# Patient Record
Sex: Male | Born: 1985 | Race: Asian | Hispanic: No | Marital: Married | Smoking: Never smoker
Health system: Southern US, Community
[De-identification: ages and names within clinical notes are randomized; demographics above are authoritative.]

---

## 2016-02-18 ENCOUNTER — Ambulatory Visit (INDEPENDENT_AMBULATORY_CARE_PROVIDER_SITE_OTHER): Payer: No Typology Code available for payment source

## 2016-02-18 ENCOUNTER — Ambulatory Visit (INDEPENDENT_AMBULATORY_CARE_PROVIDER_SITE_OTHER): Payer: No Typology Code available for payment source | Admitting: Student

## 2016-02-18 VITALS — BP 120/90 | HR 125 | Temp 98.2°F | Resp 18 | Ht 63.5 in | Wt 186.4 lb

## 2016-02-18 DIAGNOSIS — M5441 Lumbago with sciatica, right side: Secondary | ICD-10-CM

## 2016-02-18 DIAGNOSIS — R000 Tachycardia, unspecified: Secondary | ICD-10-CM | POA: Diagnosis not present

## 2016-02-18 MED ORDER — PREDNISONE 20 MG PO TABS: 20.0000 mg | ORAL_TABLET | Freq: Every day | ORAL | 0 refills | Status: DC

## 2016-02-18 NOTE — Assessment & Plan Note (Signed)
Lumbar x-rays negative.  Prednisone for 5 days, then stop.  Follow up next week to reassess.

## 2016-02-18 NOTE — Progress Notes (Signed)
   Subjective:    Patient ID: Julian Mclean, male    DOB: 08/24/1985, 31 y.o.   MRN: [id number]  HPI Presents with 3 days of back pain and 1 day of radiculopathy.  No known injury, no known heavy lifting.  Had this happen 1 year ago, which went away with medications.  Denies heavy lifting at that time.  He denies bowel/bladder incontinence.    Denies chest pain, shortness of breath, palpitations on exam.  No family history of cardiac or thyroid disease.  No recent medication, drug use.  PMHx - Updated and reviewed.  Contributory factors include: Negative PSHx - Updated and reviewed.  Contributory factors include:  Negative FHx - Updated and reviewed.  Contributory factors include:  Negative Social Hx - Updated and reviewed. Contributory factors include: Negative Medications - reviewed     Review of Systems  Constitutional: Negative for chills, fatigue and fever.  HENT: Negative for congestion and sore throat.   Eyes: Negative for pain and itching.  Respiratory: Negative for cough, chest tightness and shortness of breath.   Cardiovascular: Negative for chest pain, palpitations and leg swelling.  Genitourinary: Negative for difficulty urinating, dysuria and urgency.  Musculoskeletal: Positive for back pain. Negative for arthralgias, joint swelling and myalgias.  Skin: Negative for pallor, rash and wound.  Neurological: Negative for dizziness and weakness.  All other systems reviewed and are negative.      Objective:   Physical Exam  Constitutional: He is oriented to person, place, and time. He appears well-developed and well-nourished. No distress.  HENT:  Head: Normocephalic and atraumatic.  Eyes: Conjunctivae are normal. No scleral icterus.  Neck: Normal range of motion. Neck supple. No tracheal deviation present. Thyromegaly present.  Cardiovascular: Regular rhythm.  Exam reveals no gallop and no friction rub.   No murmur heard. tachycardic  Pulmonary/Chest: Effort normal  and breath sounds normal. No respiratory distress. He has no wheezes.  Musculoskeletal: He exhibits no edema, tenderness or deformity.  No weakness of LE b/l, SLE negative b/l  Lymphadenopathy:    He has no cervical adenopathy.  Neurological: He is alert and oriented to person, place, and time. He displays normal reflexes. He exhibits normal muscle tone.  Skin: Skin is warm. No rash noted. He is not diaphoretic. No erythema. No pallor.  Psychiatric: He has a normal mood and affect. His behavior is normal. Judgment and thought content normal.  Nursing note and vitals reviewed.  BP 120/90 (BP Location: Right Arm, Patient Position: Sitting, Cuff Size: Large)   Pulse (!) 125   Temp 98.2 F (36.8 C) (Oral)   Resp 18   Ht 5' 3.5" (1.613 m)   Wt 186 lb 6.4 oz (84.6 kg)   SpO2 99%   BMI 32.50 kg/m         Assessment & Plan:  Tachycardia EKG shows sinus tachycardia.  VS otherwise stable.  Not in extraordinary amount of pain.  Not volume depleted.  Will check TSH, CBC, CMP.  Follow up 1 week.  Acute bilateral low back pain with right-sided sciatica Lumbar x-rays negative.  Prednisone for 5 days, then stop.  Follow up next week to reassess.  Signed,  Corliss MarcusAlicia Barnes, DO [facility] 6:37 PM 02/18/16

## 2016-02-18 NOTE — Assessment & Plan Note (Addendum)
EKG shows sinus tachycardia.  VS otherwise stable.  Not in extraordinary amount of pain.  Not volume depleted.  Will check TSH, CBC, CMP.  Follow up 1 week.

## 2016-02-18 NOTE — Patient Instructions (Signed)
Take 2 tablets of medication daily.  Follow up next week on Tuesday in the afternoon.    Go to Emergency Room if having chest pain, shortness of breath.

## 2016-02-19 LAB — CBC WITH DIFFERENTIAL/PLATELET
BASOS: 0 %
Basophils Absolute: 0 10*3/uL (ref 0.0–0.2)
EOS (ABSOLUTE): 0.1 10*3/uL (ref 0.0–0.4)
EOS: 2 %
HEMATOCRIT: 43.7 % (ref 37.5–51.0)
HEMOGLOBIN: 15.3 g/dL (ref 13.0–17.7)
Immature Grans (Abs): 0 10*3/uL (ref 0.0–0.1)
Immature Granulocytes: 0 %
Lymphocytes Absolute: 3.2 10*3/uL — ABNORMAL HIGH (ref 0.7–3.1)
Lymphs: 40 %
MCH: 28 pg (ref 26.6–33.0)
MCHC: 35 g/dL (ref 31.5–35.7)
MCV: 80 fL (ref 79–97)
MONOCYTES: 7 %
Monocytes Absolute: 0.5 10*3/uL (ref 0.1–0.9)
NEUTROS ABS: 4 10*3/uL (ref 1.4–7.0)
Neutrophils: 51 %
Platelets: 343 10*3/uL (ref 150–379)
RBC: 5.46 x10E6/uL (ref 4.14–5.80)
RDW: 13 % (ref 12.3–15.4)
WBC: 7.8 10*3/uL (ref 3.4–10.8)

## 2016-02-19 LAB — COMPREHENSIVE METABOLIC PANEL
ALBUMIN: 4.9 g/dL (ref 3.5–5.5)
ALT: 32 IU/L (ref 0–44)
AST: 28 IU/L (ref 0–40)
Albumin/Globulin Ratio: 1.6 (ref 1.2–2.2)
Alkaline Phosphatase: 63 IU/L (ref 39–117)
BUN/Creatinine Ratio: 14 (ref 9–20)
BUN: 11 mg/dL (ref 6–20)
Bilirubin Total: 0.6 mg/dL (ref 0.0–1.2)
CO2: 23 mmol/L (ref 18–29)
CREATININE: 0.79 mg/dL (ref 0.76–1.27)
Calcium: 9.9 mg/dL (ref 8.7–10.2)
Chloride: 98 mmol/L (ref 96–106)
GFR, EST AFRICAN AMERICAN: 141 mL/min/{1.73_m2} (ref >59)
GFR, EST NON AFRICAN AMERICAN: 122 mL/min/{1.73_m2} (ref >59)
GLOBULIN, TOTAL: 3 g/dL (ref 1.5–4.5)
GLUCOSE: 102 mg/dL — AB (ref 65–99)
Potassium: 4.3 mmol/L (ref 3.5–5.2)
Sodium: 138 mmol/L (ref 134–144)
TOTAL PROTEIN: 7.9 g/dL (ref 6.0–8.5)

## 2016-02-19 LAB — TSH: TSH: 1.56 u[IU]/mL (ref 0.450–4.500)

## 2016-02-21 ENCOUNTER — Telehealth: Payer: Self-pay

## 2016-02-21 NOTE — Telephone Encounter (Signed)
Pt would like his lab results  

## 2016-02-22 NOTE — Telephone Encounter (Signed)
Please review and advise.

## 2016-02-25 ENCOUNTER — Ambulatory Visit (INDEPENDENT_AMBULATORY_CARE_PROVIDER_SITE_OTHER): Payer: No Typology Code available for payment source | Admitting: Student

## 2016-02-25 VITALS — BP 124/80 | HR 104 | Temp 98.3°F | Resp 17 | Ht 64.0 in | Wt 188.0 lb

## 2016-02-25 DIAGNOSIS — M222X2 Patellofemoral disorders, left knee: Secondary | ICD-10-CM

## 2016-02-25 DIAGNOSIS — M5441 Lumbago with sciatica, right side: Secondary | ICD-10-CM | POA: Diagnosis not present

## 2016-02-25 DIAGNOSIS — R000 Tachycardia, unspecified: Secondary | ICD-10-CM

## 2016-02-25 MED ORDER — NAPROXEN 500 MG PO TABS
500.0000 mg | ORAL_TABLET | Freq: Two times a day (BID) | ORAL | 1 refills | Status: DC
Start: 2016-02-25 — End: 2016-05-21

## 2016-02-25 NOTE — Progress Notes (Signed)
Subjective:    Patient ID: Julian Mclean, male    DOB: 04/09/1985, 31 y.o.   MRN: [id number]  HPI Presents for follow up of back pain and tachycardia.  His back pain has resolved with prednisone. No radicular symptoms or numbness or tingling now.  He has been doing cardio, but no core exercises.  Sciatica resolved with prednisone.    He complains of left knee pain while going up and down stairs and sometimes when he walks or stands for long periods of time.  He denies any recent injury.  Denies swelling to the knee.  Had tachycardia last week on exam.  He denies symptoms of palpitations, chest pain, shortness of breath, syncope/presyncope.  He has been monitoring his heart rate with his FitBit, which seems to be accurate.  It usually runs in the 80's at rest.  It will jump to 120's while walking.  Reviewed normal labs with patient.  PMHx - Updated and reviewed.  Contributory factors include: Negative PSHx - Updated and reviewed.  Contributory factors include:  Negative FHx - Updated and reviewed.  Contributory factors include:  Negative Social Hx - Updated and reviewed. Contributory factors include: Negative Medications - reviewed    Review of Systems  Constitutional: Negative for chills, fatigue and fever.  HENT: Negative for congestion and sore throat.   Eyes: Negative for pain and itching.  Respiratory: Negative for cough, chest tightness and shortness of breath.   Cardiovascular: Negative for chest pain, palpitations and leg swelling.  Genitourinary: Negative for dysuria and urgency.  Musculoskeletal: Positive for back pain. Negative for arthralgias, gait problem, joint swelling and myalgias.  Skin: Negative for pallor, rash and wound.  Neurological: Negative for dizziness, weakness, light-headedness, numbness and headaches.  All other systems reviewed and are negative.      Objective:   Physical Exam  Constitutional: He is oriented to person, place, and time. He appears  well-developed and well-nourished. No distress.  HENT:  Head: Normocephalic and atraumatic.  Eyes: Conjunctivae are normal. No scleral icterus.  Neck: Normal range of motion. Neck supple.  Cardiovascular: Normal rate and regular rhythm.  Exam reveals no gallop and no friction rub.   No murmur heard. Pulmonary/Chest: Effort normal and breath sounds normal.  Musculoskeletal: He exhibits no edema or deformity.  Neurological: He is alert and oriented to person, place, and time.  Skin: Skin is warm. No rash noted. He is not diaphoretic. No erythema. No pallor.  Psychiatric: He has a normal mood and affect. His behavior is normal. Judgment and thought content normal.  MSK: Knee: Normal to inspection with no erythema or effusion or obvious bony abnormalities. Palpation normal with no warmth, joint line tenderness, patellar tenderness, or condyle tenderness. ROM full in flexion and extension and lower leg rotation. Ligaments with solid consistent endpoints including ACL, PCL, LCL, MCL. Negative Mcmurray's, Apley's, and Thessalonian tests. Non painful patellar compression. Patellar glide without crepitus. Patellar and quadriceps tendons unremarkable. Hamstring and quadriceps strength is normal.    BP 124/80 (BP Location: Right Arm, Patient Position: Sitting, Cuff Size: Normal)   Pulse (!) 104   Temp 98.3 F (36.8 C) (Oral)   Resp 17   Ht 5\' 4"  (1.626 m)   Wt 188 lb (85.3 kg)   SpO2 99%   BMI 32.27 kg/m         Assessment & Plan:  Tachycardia Heart rate decreased in room below 100.  Believe it jumps higher when he is moving.  Admits to some  white coat syndrome symptoms.  Continue to monitor with FitBit.  Follow up if >100 consistently or if having symptoms.  EKG last week WNL.  Acute bilateral low back pain with right-sided sciatica Recommend core exercises.  Can take mobic for pain.  Improved with prednisone.  Patellofemoral syndrome of left knee Mobic and HEP.  Follow up if not  improved.  Could consider PT> Signed,  Corliss Marcus, DO [facility] 10:46 PM 02/26/16

## 2016-02-25 NOTE — Patient Instructions (Addendum)
Take your medication up to twice daily as needed  Keep an eye on your heart rate   IF you received an x-ray today, you will receive an invoice from [facility]. Please contact [facility] at [phone] with questions or concerns regarding your invoice.   IF you received labwork today, you will receive an invoice from [facility]. Please contact [facility] at [phone] with questions or concerns regarding your invoice.   Our billing staff will not be able to assist you with questions regarding bills from these companies.  You will be contacted with the lab results as soon as they are available. The fastest way to get your results is to activate your My Chart account. Instructions are located on the last page of this paperwork. If you have not heard from us regarding the results in 2 weeks, please contact this office.

## 2016-02-26 DIAGNOSIS — M222X2 Patellofemoral disorders, left knee: Secondary | ICD-10-CM | POA: Insufficient documentation

## 2016-02-26 NOTE — Assessment & Plan Note (Signed)
Recommend core exercises.  Can take mobic for pain.  Improved with prednisone.

## 2016-02-26 NOTE — Assessment & Plan Note (Signed)
Heart rate decreased in room below 100.  Believe it jumps higher when he is moving.  Admits to some white coat syndrome symptoms.  Continue to monitor with FitBit.  Follow up if >100 consistently or if having symptoms.  EKG last week WNL.

## 2016-02-26 NOTE — Assessment & Plan Note (Addendum)
Naprosyn and HEP.  Follow up if not improved.  Could consider PT.

## 2016-03-17 ENCOUNTER — Ambulatory Visit (INDEPENDENT_AMBULATORY_CARE_PROVIDER_SITE_OTHER): Payer: No Typology Code available for payment source

## 2016-03-17 ENCOUNTER — Ambulatory Visit (INDEPENDENT_AMBULATORY_CARE_PROVIDER_SITE_OTHER): Payer: No Typology Code available for payment source | Admitting: Student

## 2016-03-17 VITALS — BP 138/90 | HR 108 | Temp 98.1°F | Resp 16 | Ht 64.0 in | Wt 189.0 lb

## 2016-03-17 DIAGNOSIS — Z23 Encounter for immunization: Secondary | ICD-10-CM | POA: Insufficient documentation

## 2016-03-17 DIAGNOSIS — M222X2 Patellofemoral disorders, left knee: Secondary | ICD-10-CM | POA: Diagnosis not present

## 2016-03-17 MED ORDER — DICLOFENAC SODIUM 75 MG PO TBEC: 75.0000 mg | DELAYED_RELEASE_TABLET | Freq: Two times a day (BID) | ORAL | 1 refills | Status: DC | PRN

## 2016-03-17 NOTE — Patient Instructions (Signed)
     IF you received an x-ray today, you will receive an invoice from [facility]. Please contact [facility] at [phone] with questions or concerns regarding your invoice.   IF you received labwork today, you will receive an invoice from [facility]. Please contact [facility] at [phone] with questions or concerns regarding your invoice.   Our billing staff will not be able to assist you with questions regarding bills from these companies.  You will be contacted with the lab results as soon as they are available. The fastest way to get your results is to activate your My Chart account. Instructions are located on the last page of this paperwork. If you have not heard from us regarding the results in 2 weeks, please contact this office.     

## 2016-03-17 NOTE — Assessment & Plan Note (Signed)
X-rays negative.  Continue anti-inflammatories and HEP.  If not improved, then PT recommended.

## 2016-03-17 NOTE — Progress Notes (Signed)
  Julian Mclean - 31 y.o. male MRN [id number]  Date of birth: 10/30/1985  SUBJECTIVE:  Including CC & ROS.  CC: Left knee  pain  Presents with left knee pain that has been ongoing for the past few months. He was seen previously and diagnosed with patellofemoral syndrome of his left knee.  Denies any injury and denies any swelling.Marland Kitchen. He reports that there is mainly surrounding. Notices that it is worse when going up or down stairs or walking for long periods.   ROS: No unexpected weight loss, fever, chills, swelling, instability, muscle pain, numbness/tingling, redness, otherwise see HPI   PMHx - Updated and reviewed.  Contributory factors include: Negative PSHx - Updated and reviewed.  Contributory factors include:  Negative FHx - Updated and reviewed.  Contributory factors include:  Negative Social Hx - Updated and reviewed. Contributory factors include: Negative Medications - reviewed   DATA REVIEWED: Previous office visits  PHYSICAL EXAM:  VS: BP:138/90  HR:(!) 108bpm  TEMP:98.1 F (36.7 C)(Oral)  RESP:99 %  HT:5\' 4"  (162.6 cm)   WT:189 lb (85.7 kg)  BMI:32.5 PHYSICAL EXAM: Gen: NAD, alert, cooperative with exam, well-appearing HEENT: clear conjunctiva,  CV:  no edema, capillary refill brisk, normal rate Resp: non-labored Skin: no rashes, normal turgor  Neuro: no gross deficits.  Psych:  alert and oriented  Knee: Normal to inspection with no erythema or effusion or obvious bony abnormalities. Palpation normal with no warmth, joint line tenderness, patellar tenderness, or condyle tenderness. ROM full in flexion and extension and lower leg rotation. Ligaments with solid consistent endpoints including ACL, PCL, LCL, MCL. Negative Mcmurray's, Apley's, and Thessalonian tests. Non painful patellar compression. Patellar glide without crepitus. Patellar and quadriceps tendons unremarkable. Hamstring and quadriceps strength is normal.     ASSESSMENT & PLAN:    Patellofemoral syndrome of left knee X-rays negative.  Continue anti-inflammatories and HEP.  If not improved, then PT recommended.

## 2016-04-19 ENCOUNTER — Other Ambulatory Visit: Payer: Self-pay | Admitting: Family Medicine

## 2016-04-20 ENCOUNTER — Other Ambulatory Visit: Payer: Self-pay | Admitting: *Deleted

## 2016-04-20 MED ORDER — DICLOFENAC SODIUM 75 MG PO TBEC: 75.0000 mg | DELAYED_RELEASE_TABLET | Freq: Two times a day (BID) | ORAL | 1 refills | Status: DC | PRN

## 2016-04-23 ENCOUNTER — Ambulatory Visit (INDEPENDENT_AMBULATORY_CARE_PROVIDER_SITE_OTHER): Payer: No Typology Code available for payment source | Admitting: Physician Assistant

## 2016-04-23 VITALS — BP 136/89 | HR 106 | Temp 98.1°F | Resp 16 | Ht 64.0 in | Wt 184.0 lb

## 2016-04-23 DIAGNOSIS — M222X2 Patellofemoral disorders, left knee: Secondary | ICD-10-CM | POA: Diagnosis not present

## 2016-04-23 MED ORDER — DICLOFENAC SODIUM 1 % TD GEL: 2.0000 g | Freq: Four times a day (QID) | TRANSDERMAL | 0 refills | Status: AC

## 2016-04-23 NOTE — Patient Instructions (Addendum)
For knee pain, I would like you to go and by ortho inserts at [facility] and use these in your shoes daily. I also have prescribed topical anti inflammatory medication you can use to the affected area. Do not use both this and oral diclofenac as this is not recommended. Also try incorporating tumeric into your diet more frequently. Continue ice, brace, and PT. If no improvement after 4 weeks of PT, consider going back to ortho.   Please let me know if you have any other questions or concerns.    IF you received an x-ray today, you will receive an invoice from [facility]. Please contact [facility] at [phone] with questions or concerns regarding your invoice.   IF you received labwork today, you will receive an invoice from [facility]. Please contact [facility] at [phone] with questions or concerns regarding your invoice.   Our billing staff will not be able to assist you with questions regarding bills from these companies.  You will be contacted with the lab results as soon as they are available. The fastest way to get your results is to activate your My Chart account. Instructions are located on the last page of this paperwork. If you have not heard from us regarding the results in 2 weeks, please contact this office.

## 2016-04-23 NOTE — Progress Notes (Signed)
Julian Mclean  MRN: [id number] DOB: 12/22/1985  Subjective:  Julian PrinceLingaswamy Liburd is a 31 y.o. male seen in office today for a chief complaint of left knee pain x a few months. He has been evaluated by Dr. Rory Percyhitanand 02/2016 for this issue and an orthopedic surgeon.Has dx of patellofemoral syndrome.  Xray of left knee from 03/17/16 was negative. He has been going to PT and taking medication as prescribed. He is icing and wearing the brace consistently.  Notes there has not been much difference since the last visit except that in the past two days it has felt a lot better after he eats chicken. Denies acute injury, swelling, redness, and warmth. Pain is worsened with standing for long periods of time. He is wondering if there is anything else he can try and if there is any topical medication he could use.   Review of Systems  Constitutional: Negative for appetite change, diaphoresis and fever.    Patient Active Problem List   Diagnosis Date Noted  . Flu vaccine need 03/17/2016  . Patellofemoral syndrome of left knee 02/26/2016  . Tachycardia 02/18/2016  . Acute bilateral low back pain with right-sided sciatica 02/18/2016    Current Outpatient Prescriptions on File Prior to Visit  Medication Sig Dispense Refill  . diclofenac (VOLTAREN) 75 MG EC tablet Take 1 tablet (75 mg total) by mouth 2 (two) times daily as needed. 30 tablet 1  . naproxen (NAPROSYN) 500 MG tablet Take 1 tablet (500 mg total) by mouth 2 (two) times daily with a meal. (Patient not taking: Reported on 04/23/2016) 60 tablet 1   No current facility-administered medications on file prior to visit.     No Known Allergies   Objective:  BP 136/89 (BP Location: Left Arm, Patient Position: Sitting, Cuff Size: Large)   Pulse (!) 106   Temp 98.1 F (36.7 C) (Oral)   Resp 16   Ht 5\' 4"  (1.626 m)   Wt 184 lb (83.5 kg)   SpO2 98%   BMI 31.58 kg/m   Physical Exam  Constitutional: He is oriented to person, place, and time and  well-developed, well-nourished, and in no distress.  HENT:  Head: Normocephalic and atraumatic.  Eyes: Conjunctivae are normal.  Neck: Normal range of motion.  Pulmonary/Chest: Effort normal.  Musculoskeletal:       Right knee: Normal.       Left knee: He exhibits normal range of motion, no swelling, no effusion, no ecchymosis, no erythema, no LCL laxity, normal patellar mobility, no bony tenderness and no MCL laxity. Tenderness (mild) found. Patellar tendon (and quadriceps tendon) tenderness noted. No medial joint line, no lateral joint line, no MCL and no LCL tenderness noted.  Left knee strength 5/5.    Neurological: He is alert and oriented to person, place, and time. Gait normal.  Skin: Skin is warm and dry.  Psychiatric: Affect normal.  Vitals reviewed.   Assessment and Plan :  1. Patellofemoral syndrome of left knee Encouraged him to continue PT, brace, ice, and eating a healthy diet. Informed to go buy orthotic shoe inserts as this may help with his pain. Given Rx for topical diclofenac to try. Informed him to not use both topical and oral diclofenac in the same 24 hour period. Pt understands. Instructed that if he is still having pain after completion of PT that he should return back to his orthopedic for further evaluation.  - diclofenac sodium (VOLTAREN) 1 % GEL; Apply 2 g topically 4 (four)  times daily.  Dispense: 100 g; Refill: 0  Benjiman Core PA-C  [facility] 04/23/2016 4:49 PM

## 2016-05-21 ENCOUNTER — Other Ambulatory Visit: Payer: Self-pay | Admitting: Student

## 2016-05-21 ENCOUNTER — Other Ambulatory Visit: Payer: Self-pay | Admitting: Physician Assistant

## 2016-05-21 ENCOUNTER — Ambulatory Visit (INDEPENDENT_AMBULATORY_CARE_PROVIDER_SITE_OTHER): Payer: No Typology Code available for payment source | Admitting: Physician Assistant

## 2016-05-21 VITALS — BP 138/87 | HR 69 | Temp 98.4°F | Resp 18 | Ht 64.0 in | Wt 183.0 lb

## 2016-05-21 DIAGNOSIS — M222X2 Patellofemoral disorders, left knee: Secondary | ICD-10-CM | POA: Diagnosis not present

## 2016-05-21 MED ORDER — ACETAMINOPHEN 500 MG PO TABS: 500.0000 mg | ORAL_TABLET | Freq: Four times a day (QID) | ORAL | 0 refills | Status: AC | PRN

## 2016-05-21 NOTE — Patient Instructions (Addendum)
For the knee pain, I am excited that it has gotten better. I recommend continuing PT exercises as they taught you. In terms of medication, I recommend discontinuing the diclofenac tablet at this time. You can use tylenol as needed for pain or the topical diclofenac. However, try to see how you do over the next few days without any medication. In terms of further recommendations, make sure the brace you are wearing is not too tight as this will lead to a numbness sensation. Also, do not wear it under tight fitting pants as this will also lead to that sensation. I also recommend doing the bike at the gym as this has been shown to strengthen your quads and help with this pain. Please let me know if you are still having pain despite these recommendations. Thank you for letting me participate in your health and well being.   How to Use a Knee Brace A knee brace is a device that you wear to support your knee, especially if the knee is healing after an injury or surgery. There are several types of knee braces. Some are designed to prevent an injury (prophylactic brace). These are often worn during sports. Others support an injured knee (functional brace) or keep it still while it heals (rehabilitative brace). People with severe arthritis of the knee may benefit from a brace that takes some pressure off the knee (unloader brace). Most knee braces are made from a combination of cloth and metal or plastic. You may need to wear a knee brace to:  Relieve knee pain.  Help your knee support your weight (improve stability).  Help you walk farther (improve mobility).  Prevent injury.  Support your knee while it heals from surgery or from an injury. What are the risks? Generally, knee braces are very safe to wear. However, problems may occur, including:  Skin irritation that may lead to infection.  Making your condition worse if you wear the brace in the wrong way. How to use a knee brace Different braces will  have different instructions for use. Your health care provider will tell you or show you:  How to put on your brace.  How to adjust the brace.  When and how often to wear the brace.  How to remove the brace.  If you will need any assistive devices in addition to the brace, such as crutches or a cane. In general, your brace should:  Have the hinge of the brace line up with the bend of your knee.  Have straps, hooks, or tapes that fasten snugly around your leg.  Not feel too tight or too loose. How to care for a knee brace  Check your brace often for signs of damage, such as loose connections or attachments. Your knee brace may get damaged or wear out during normal use.  Wash the fabric parts of your brace with soap and water.  Read the insert that comes with your brace for other specific care instructions. Contact a health care provider if:  Your knee brace is too loose or too tight and you cannot adjust it.  Your knee brace causes skin redness, swelling, bruising, or irritation.  Your knee brace is not helping.  Your knee brace is making your knee pain worse. This information is not intended to replace advice given to you by your health care provider. Make sure you discuss any questions you have with your health care provider. Document Released: 04/04/2003 Document Revised: 06/20/2015 Document Reviewed: 05/07/2014 Elsevier Interactive  Patient Education  2017 [employer]    Patellofemoral Pain Syndrome Patellofemoral pain syndrome is a condition that involves a softening or breakdown of the tissue (cartilage) on the underside of your kneecap (patella). This causes pain in the front of the knee. The condition is also called runner's knee or chondromalacia patella. Patellofemoral pain syndrome is most common in young adults who are active in sports. Your knee is the largest joint in your body. The patella covers the front of your knee and is attached to muscles above and  below your knee. The underside of the patella is covered with a smooth type of cartilage (synovium). The smooth surface helps the patella glide easily when you move your knee. Patellofemoral pain syndrome causes swelling in the joint linings and bone surfaces in your knee. What are the causes? Patellofemoral pain syndrome can be caused by:  Overuse.  Poor alignment of your knee joints.  Weak leg muscles.  A direct blow to your kneecap. What increases the risk? You may be at risk for patellofemoral pain syndrome if you:  Do a lot of activities that can wear down your kneecap. These include:  Running.  Squatting.  Climbing stairs.  Start a new physical activity or exercise program.  Wear shoes that do not fit well.  Do not have good leg strength.  Are overweight. What are the signs or symptoms? Knee pain is the most common symptom of patellofemoral pain syndrome. This may feel like a dull, aching pain underneath your patella, in the front of your knee. There may be a popping or cracking sound when you move your knee. Pain may get worse with:  Exercise.  Climbing stairs.  Running.  Jumping.  Squatting.  Kneeling.  Sitting for a long time.  Moving or pushing on your patella. How is this diagnosed? Your health care provider may be able to diagnose patellofemoral pain syndrome from your symptoms and medical history. You may be asked about your recent physical activities and which ones cause knee pain. Your health care provider may do a physical exam with certain tests to confirm the diagnosis. These may include:  Moving your patella back and forth.  Checking your range of knee motion.  Having you squat or jump to see if you have pain.  Checking the strength of your leg muscles. An MRI of the knee may also be done. How is this treated? Patellofemoral pain syndrome can usually be treated at home with rest, ice, compression, and elevation (RICE). Other treatments may  include:  Nonsteroidal anti-inflammatory drugs (NSAIDs).  Physical therapy to stretch and strengthen your leg muscles.  Shoe inserts (orthotics) to take stress off your knee.  A knee brace or knee support.  Surgery to remove damaged cartilage or move the patella to a better position. The need for surgery is rare. Follow these instructions at home:  Take medicines only as directed by your health care provider.  Rest your knee.  When resting, keep your knee raised above the level of your heart.  Avoid activities that cause knee pain.  Apply ice to the injured area:  Put ice in a plastic bag.  Place a towel between your skin and the bag.  Leave the ice on for 20 minutes, 2-3 times a day.  Use splints, braces, knee supports, or walking aids as directed by your health care provider.  Perform stretching and strengthening exercises as directed by your health care provider or physical therapist.  Keep all follow-up visits as  directed by your health care provider. This is important. Contact a health care provider if:  Your symptoms get worse.  You are not improving with home care. This information is not intended to replace advice given to you by your health care provider. Make sure you discuss any questions you have with your health care provider. Document Released: 12/31/2008 Document Revised: 06/20/2015 Document Reviewed: 04/03/2013 Elsevier Interactive Patient Education  2017 [employer].  IF you received an x-ray today, you will receive an invoice from [facility]. Please contact [facility] at [phone] with questions or concerns regarding your invoice.   IF you received labwork today, you will receive an invoice from [facility]. Please contact [facility] at [phone] with questions or concerns regarding your invoice.   Our billing staff will not be able to assist you with questions regarding bills from these companies.  You will be contacted with  the lab results as soon as they are available. The fastest way to get your results is to activate your My Chart account. Instructions are located on the last page of this paperwork. If you have not heard from [place] regarding the results in 2 weeks, please contact this office.

## 2016-05-21 NOTE — Telephone Encounter (Signed)
Pt is calling to check on his refill of Diclofenac tablets.  He states that the pharmacy still hasn't heard back from [place] yet.  He still uses the [facility] [address]  [phone]

## 2016-05-21 NOTE — Progress Notes (Signed)
    MRN: [id number] DOB: [birth year]  Subjective:   Julian Mclean is a 31 y.o. male presenting for follow up on left knee pain. He has been evaluated by Dr. Rory Percy in January and February of 2018 for this issue and an orthopedic surgeon.Has dx of patellofemoral syndrome.  Xray of left knee from 03/17/16 was negative. He has been going to PT and taking medication as prescribed. However he has stopped going to PT and started doing hte exercises at his own gym.   Notes the pain has gotten a lot better. States the pain is 2/10 and used to be a 8/10. Pain is most noticeable with bending the knee. He continues to wear the brace underneath his jeans. He has been taking diclofenac daily for the past month and is wondering if he can continue to do so.   Julian Mclean has a current medication list which includes the following prescription(s): diclofenac, diclofenac sodium, and naproxen. Also has No Known Allergies.  Julian Mclean  has no past medical history on file. Also  has no past surgical history on file.   Objective:   Vitals: BP 138/87   Pulse 69   Temp 98.4 F (36.9 C) (Oral)   Resp 18   Ht  (1.626 m)   Wt 183 lb (83 kg)   SpO2 100%   BMI 31.41 kg/m   Physical Exam  Constitutional: He is oriented to person, place, and time. He appears well-developed and well-nourished.  HENT:  Head: Normocephalic and atraumatic.  Eyes: Conjunctivae are normal.  Neck: Normal range of motion.  Pulmonary/Chest: Effort normal.  Musculoskeletal:       Left knee: He exhibits normal range of motion. No tenderness found.  Knee brace intact on left knee.   Neurological: He is alert and oriented to person, place, and time.  Skin: Skin is warm and dry.  Psychiatric: He has a normal mood and affect.  Vitals reviewed.  No results found for this or any previous visit (from the past 24 hour(s)).  Assessment and Plan :  1. Patellofemoral syndrome of left knee Improving. Recommended to discontinue  diclofenac tablets at this time as he has been on them for at least one month. Encouraged to continue PT stretching, cycling at the gym, and wearing brace. Instructed to use tylenol or topical diclofenac as needed for pain. Return to clinic if symptoms worsen, do not improve, or as needed.  - acetaminophen (TYLENOL) 500 MG tablet; Take 1 tablet (500 mg total) by mouth every 6 (six) hours as needed.  Dispense: 30 tablet; Refill: 0  Benjiman Core, PA-C  [facility] 05/21/2016 5:29 PM

## 2016-05-21 NOTE — Telephone Encounter (Signed)
04/23/16 LAST OV and refill

## 2016-05-26 ENCOUNTER — Encounter: Payer: Self-pay | Admitting: Physician Assistant

## 2016-05-26 ENCOUNTER — Ambulatory Visit (INDEPENDENT_AMBULATORY_CARE_PROVIDER_SITE_OTHER): Payer: No Typology Code available for payment source | Admitting: Physician Assistant

## 2016-05-26 VITALS — BP 130/92 | HR 110 | Temp 98.4°F | Resp 16 | Ht 63.75 in | Wt 178.0 lb

## 2016-05-26 DIAGNOSIS — Z1389 Encounter for screening for other disorder: Secondary | ICD-10-CM | POA: Diagnosis not present

## 2016-05-26 DIAGNOSIS — Z130 Encounter for screening for diseases of the blood and blood-forming organs and certain disorders involving the immune mechanism: Secondary | ICD-10-CM

## 2016-05-26 DIAGNOSIS — Z23 Encounter for immunization: Secondary | ICD-10-CM

## 2016-05-26 DIAGNOSIS — Z13228 Encounter for screening for other metabolic disorders: Secondary | ICD-10-CM

## 2016-05-26 DIAGNOSIS — Z0000 Encounter for general adult medical examination without abnormal findings: Secondary | ICD-10-CM

## 2016-05-26 DIAGNOSIS — Z13220 Encounter for screening for lipoid disorders: Secondary | ICD-10-CM

## 2016-05-26 LAB — POCT URINALYSIS DIP (MANUAL ENTRY)
Bilirubin, UA: NEGATIVE
Glucose, UA: NEGATIVE mg/dL
Ketones, POC UA: NEGATIVE mg/dL
Leukocytes, UA: NEGATIVE
Nitrite, UA: NEGATIVE
PH UA: 5.5 (ref 5.0–8.0)
PROTEIN UA: NEGATIVE mg/dL
RBC UA: NEGATIVE
UROBILINOGEN UA: 0.2 U/dL

## 2016-05-26 NOTE — Patient Instructions (Addendum)
We will contact you in the next few days with your lab results. Your physical exam looks good! Keep up the good work with the knee. If your pain starts to worsen please let us know. For health maintenance, make sure you schedule a dentist appointment, increase brushing to at least twice a day, check bp outside the office goal is as close to 120/80 as possible (too elevated is >140/90), and start stretching daily! Below is some information about your health maintenance and some low back stretches. Thank you for letting me participate in your health and well being.   Health Maintenance, Male A healthy lifestyle and preventive care is important for your health and wellness. Ask your health care provider about what schedule of regular examinations is right for you. What should I know about weight and diet?  Eat a Healthy Diet  Eat plenty of vegetables, fruits, whole grains, low-fat dairy products, and lean protein.  Do not eat a lot of foods high in solid fats, added sugars, or salt. Maintain a Healthy Weight  Regular exercise can help you achieve or maintain a healthy weight. You should:  Do at least 150 minutes of exercise each week. The exercise should increase your heart rate and make you sweat (moderate-intensity exercise).  Do strength-training exercises at least twice a week. Watch Your Levels of Cholesterol and Blood Lipids  Have your blood tested for lipids and cholesterol every 5 years starting at 29 years of age. If you are at high risk for heart disease, you should start having your blood tested when you are 29 years old. You may need to have your cholesterol levels checked more often if:  Your lipid or cholesterol levels are high.  You are older than 29 years of age.  You are at high risk for heart disease. What should I know about cancer screening? Many types of cancers can be detected early and may often be prevented. Lung Cancer  You should be screened every year for lung  cancer if:  You are a current smoker who has smoked for at least 30 years.  You are a former smoker who has quit within the past 15 years.  Talk to your health care provider about your screening options, when you should start screening, and how often you should be screened. Colorectal Cancer  Routine colorectal cancer screening usually begins at 29 years of age and should be repeated every 5-10 years until you are 29 years old. You may need to be screened more often if early forms of precancerous polyps or small growths are found. Your health care provider may recommend screening at an earlier age if you have risk factors for colon cancer.  Your health care provider may recommend using home test kits to check for hidden blood in the stool.  A small camera at the end of a tube can be used to examine your colon (sigmoidoscopy or colonoscopy). This checks for the earliest forms of colorectal cancer. Prostate and Testicular Cancer  Depending on your age and overall health, your health care provider may do certain tests to screen for prostate and testicular cancer.  Talk to your health care provider about any symptoms or concerns you have about testicular or prostate cancer. Skin Cancer  Check your skin from head to toe regularly.  Tell your health care provider about any new moles or changes in moles, especially if:  There is a change in a mole's size, shape, or color.  You have a mole  that is larger than a pencil eraser.  Always use sunscreen. Apply sunscreen liberally and repeat throughout the day.  Protect yourself by wearing long sleeves, pants, a wide-brimmed hat, and sunglasses when outside. What should I know about heart disease, diabetes, and high blood pressure?  If you are 74-93 years of age, have your blood pressure checked every 3-5 years. If you are 68 years of age or older, have your blood pressure checked every year. You should have your blood pressure measured twice-once  when you are at a hospital or clinic, and once when you are not at a hospital or clinic. Record the average of the two measurements. To check your blood pressure when you are not at a hospital or clinic, you can use:  An automated blood pressure machine at a pharmacy.  A home blood pressure monitor.  Talk to your health care provider about your target blood pressure.  If you are between 52-46 years old, ask your health care provider if you should take aspirin to prevent heart disease.  Have regular diabetes screenings by checking your fasting blood sugar level.  If you are at a normal weight and have a low risk for diabetes, have this test once every three years after the age of 26.  If you are overweight and have a high risk for diabetes, consider being tested at a younger age or more often.  A one-time screening for abdominal aortic aneurysm (AAA) by ultrasound is recommended for men aged 65-75 years who are current or former smokers. What should I know about preventing infection? Hepatitis B  If you have a higher risk for hepatitis B, you should be screened for this virus. Talk with your health care provider to find out if you are at risk for hepatitis B infection. Hepatitis C  Blood testing is recommended for:  Everyone born from [birth year] through 1963.  Anyone with known risk factors for hepatitis C. Sexually Transmitted Diseases (STDs)  You should be screened each year for STDs including gonorrhea and chlamydia if:  You are sexually active and are younger than 29 years of age.  You are older than 29 years of age and your health care provider tells you that you are at risk for this type of infection.  Your sexual activity has changed since you were last screened and you are at an increased risk for chlamydia or gonorrhea. Ask your health care provider if you are at risk.  Talk with your health care provider about whether you are at high risk of being infected with HIV. Your health  care provider may recommend a prescription medicine to help prevent HIV infection. What else can I do?  Schedule regular health, dental, and eye exams.  Stay current with your vaccines (immunizations).  Do not use any tobacco products, such as cigarettes, chewing tobacco, and e-cigarettes. If you need help quitting, ask your health care provider.  Limit alcohol intake to no more than 2 drinks per day. One drink equals 12 ounces of beer, 5 ounces of wine, or 1 ounces of hard liquor.  Do not use street drugs.  Do not share needles.  Ask your health care provider for help if you need support or information about quitting drugs.  Tell your health care provider if you often feel depressed.  Tell your health care provider if you have ever been abused or do not feel safe at home. This information is not intended to replace advice given to you by your health  care provider. Make sure you discuss any questions you have with your health care provider. Document Released: 07/11/2007 Document Revised: 09/11/2015 Document Reviewed: 10/16/2014 Elsevier Interactive Patient Education  2017 [employer]   Hypertension Hypertension is another name for high blood pressure. High blood pressure forces your heart to work harder to pump blood. This can cause problems over time. There are two numbers in a blood pressure reading. There is a top number (systolic) over a bottom number (diastolic). It is best to have a blood pressure below 120/80. Healthy choices can help lower your blood pressure. You may need medicine to help lower your blood pressure if:  Your blood pressure cannot be lowered with healthy choices.  Your blood pressure is higher than 130/80. Follow these instructions at home: Eating and drinking   If directed, follow the DASH eating plan. This diet includes:  Filling half of your plate at each meal with fruits and vegetables.  Filling one quarter of your plate at each meal with whole  grains. Whole grains include whole wheat pasta, brown rice, and whole grain bread.  Eating or drinking low-fat dairy products, such as skim milk or low-fat yogurt.  Filling one quarter of your plate at each meal with low-fat (lean) proteins. Low-fat proteins include fish, skinless chicken, eggs, beans, and tofu.  Avoiding fatty meat, cured and processed meat, or chicken with skin.  Avoiding premade or processed food.  Eat less than 1,500 mg of salt (sodium) a day.  Limit alcohol use to no more than 1 drink a day for nonpregnant women and 2 drinks a day for men. One drink equals 12 oz of beer, 5 oz of wine, or 1 oz of hard liquor. Lifestyle   Work with your doctor to stay at a healthy weight or to lose weight. Ask your doctor what the best weight is for you.  Get at least 30 minutes of exercise that causes your heart to beat faster (aerobic exercise) most days of the week. This may include walking, swimming, or biking.  Get at least 30 minutes of exercise that strengthens your muscles (resistance exercise) at least 3 days a week. This may include lifting weights or pilates.  Do not use any products that contain nicotine or tobacco. This includes cigarettes and e-cigarettes. If you need help quitting, ask your doctor.  Check your blood pressure at home as told by your doctor.  Keep all follow-up visits as told by your doctor. This is important. Medicines   Take over-the-counter and prescription medicines only as told by your doctor. Follow directions carefully.  Do not skip doses of blood pressure medicine. The medicine does not work as well if you skip doses. Skipping doses also puts you at risk for problems.  Ask your doctor about side effects or reactions to medicines that you should watch for. Contact a doctor if:  You think you are having a reaction to the medicine you are taking.  You have headaches that keep coming back (recurring).  You feel dizzy.  You have swelling  in your ankles.  You have trouble with your vision. Get help right away if:  You get a very bad headache.  You start to feel confused.  You feel weak or numb.  You feel faint.  You get very bad pain in your:  Chest.  Belly (abdomen).  You throw up (vomit) more than once.  You have trouble breathing. Summary  Hypertension is another name for high blood pressure.  Making healthy  choices can help lower blood pressure. If your blood pressure cannot be controlled with healthy choices, you may need to take medicine. This information is not intended to replace advice given to you by your health care provider. Make sure you discuss any questions you have with your health care provider. Document Released: 07/01/2007 Document Revised: 12/11/2015 Document Reviewed: 12/11/2015 Elsevier Interactive Patient Education  2017 [employer].    IF you received an x-ray today, you will receive an invoice from [facility]. Please contact [facility] at [phone] with questions or concerns regarding your invoice.   IF you received labwork today, you will receive an invoice from [facility]. Please contact [facility] at [phone] with questions or concerns regarding your invoice.   Our billing staff will not be able to assist you with questions regarding bills from these companies.  You will be contacted with the lab results as soon as they are available. The fastest way to get your results is to activate your My Chart account. Instructions are located on the last page of this paperwork. If you have not heard from [place] regarding the results in 2 weeks, please contact this office.      Low Back Sprain Rehab Ask your health care provider which exercises are safe for you. Do exercises exactly as told by your health care provider and adjust them as directed. It is normal to feel mild stretching, pulling, tightness, or discomfort as you do these exercises, but you should stop right  away if you feel sudden pain or your pain gets worse. Do not begin these exercises until told by your health care provider. Stretching and range of motion exercises These exercises warm up your muscles and joints and improve the movement and flexibility of your back. These exercises also help to relieve pain, numbness, and tingling. Exercise A: Lumbar rotation   1. Lie on your back on a firm surface and bend your knees. 2. Straighten your arms out to your sides so each arm forms an "L" shape with a side of your body (a 90 degree angle). 3. Slowly move both of your knees to one side of your body until you feel a stretch in your lower back. Try not to let your shoulders move off of the floor. 4. Hold for __________ seconds. 5. Tense your abdominal muscles and slowly move your knees back to the starting position. 6. Repeat this exercise on the other side of your body. Repeat __________ times. Complete this exercise __________ times a day. Exercise B: Prone extension on elbows   1. Lie on your abdomen on a firm surface. 2. Prop yourself up on your elbows. 3. Use your arms to help lift your chest up until you feel a gentle stretch in your abdomen and your lower back.  This will place some of your body weight on your elbows. If this is uncomfortable, try stacking pillows under your chest.  Your hips should stay down, against the surface that you are lying on. Keep your hip and back muscles relaxed. 4. Hold for __________ seconds. 5. Slowly relax your upper body and return to the starting position. Repeat __________ times. Complete this exercise __________ times a day. Strengthening exercises These exercises build strength and endurance in your back. Endurance is the ability to use your muscles for a long time, even after they get tired. Exercise C: Pelvic tilt  1. Lie on your back on a firm surface. Bend your knees and keep your feet flat. 2. Tense your  abdominal muscles. Tip your pelvis up  toward the ceiling and flatten your lower back into the floor.  To help with this exercise, you may place a small towel under your lower back and try to push your back into the towel. 3. Hold for __________ seconds. 4. Let your muscles relax completely before you repeat this exercise. Repeat __________ times. Complete this exercise __________ times a day. Exercise D: Alternating arm and leg raises   1. Get on your hands and knees on a firm surface. If you are on a hard floor, you may want to use padding to cushion your knees, such as an exercise mat. 2. Line up your arms and legs. Your hands should be below your shoulders, and your knees should be below your hips. 3. Lift your left leg behind you. At the same time, raise your right arm and straighten it in front of you.  Do not lift your leg higher than your hip.  Do not lift your arm higher than your shoulder.  Keep your abdominal and back muscles tight.  Keep your hips facing the ground.  Do not arch your back.  Keep your balance carefully, and do not hold your breath. 4. Hold for __________ seconds. 5. Slowly return to the starting position and repeat with your right leg and your left arm. Repeat __________ times. Complete this exercise __________ times a day. Exercise E: Abdominal set with straight leg raise   1. Lie on your back on a firm surface. 2. Bend one of your knees and keep your other leg straight. 3. Tense your abdominal muscles and lift your straight leg up, 4-6 inches (10-15 cm) off the ground. 4. Keep your abdominal muscles tight and hold for __________ seconds.  Do not hold your breath.  Do not arch your back. Keep it flat against the ground. 5. Keep your abdominal muscles tense as you slowly lower your leg back to the starting position. 6. Repeat with your other leg. Repeat __________ times. Complete this exercise __________ times a day. Posture and body mechanics   Body mechanics refers to the movements  and positions of your body while you do your daily activities. Posture is part of body mechanics. Good posture and healthy body mechanics can help to relieve stress in your body's tissues and joints. Good posture means that your spine is in its natural S-curve position (your spine is neutral), your shoulders are pulled back slightly, and your head is not tipped forward. The following are general guidelines for applying improved posture and body mechanics to your everyday activities. Standing    When standing, keep your spine neutral and your feet about hip-width apart. Keep a slight bend in your knees. Your ears, shoulders, and hips should line up.  When you do a task in which you stand in one place for a long time, place one foot up on a stable object that is 2-4 inches (5-10 cm) high, such as a footstool. This helps keep your spine neutral. Sitting    When sitting, keep your spine neutral and keep your feet flat on the floor. Use a footrest, if necessary, and keep your thighs parallel to the floor. Avoid rounding your shoulders, and avoid tilting your head forward.  When working at a desk or a computer, keep your desk at a height where your hands are slightly lower than your elbows. Slide your chair under your desk so you are close enough to maintain good posture.  When working at a  computer, place your monitor at a height where you are looking straight ahead and you do not have to tilt your head forward or downward to look at the screen. Resting    When lying down and resting, avoid positions that are most painful for you.  If you have pain with activities such as sitting, bending, stooping, or squatting (flexion-based activities), lie in a position in which your body does not bend very much. For example, avoid curling up on your side with your arms and knees near your chest (fetal position).  If you have pain with activities such as standing for a long time or reaching with your arms  (extension-based activities), lie with your spine in a neutral position and bend your knees slightly. Try the following positions:  Lying on your side with a pillow between your knees.  Lying on your back with a pillow under your knees. Lifting    When lifting objects, keep your feet at least shoulder-width apart and tighten your abdominal muscles.  Bend your knees and hips and keep your spine neutral. It is important to lift using the strength of your legs, not your back. Do not lock your knees straight out.  Always ask for help to lift heavy or awkward objects. This information is not intended to replace advice given to you by your health care provider. Make sure you discuss any questions you have with your health care provider. Document Released: 01/12/2005 Document Revised: 09/19/2015 Document Reviewed: 10/24/2014 Elsevier Interactive Patient Education  2017 [employer].

## 2016-05-26 NOTE — Progress Notes (Signed)
Julian Mclean  MRN: [id number] DOB: 07-27-1985  Subjective:  Pt is a 31 y.o. male who presents for annual physical exam. Pt is not fasting today. He ate 5 hours prior to arrival   Social: Pt is from [place].  has been in [place] for 2 years. He works as a Teacher, English as a foreign language. He is single. He is not sexually active.   Diet: He eats boiled eggs for breakfast, rice with curry for lunch, and protein for dinner. He drinks mango juice and water daily.   Exercise: He goes to gym 6 days a week. Does cycling and weights. Gets at least 10,000 steps a day.  Sleep: Gets about 8 hours a night, feels rested throughout the day.  Bowel Movements: Has at least one, maybe two a day.   Last annual physical: Never Last dental exam: 2012, brushes once a day in the morning .  Last vision exam: 2017  Vaccinations      Tetanus      HPV      Zostavax  Patient Active Problem List   Diagnosis Date Noted  . Flu vaccine need 03/17/2016  . Patellofemoral syndrome of left knee 02/26/2016  . Tachycardia 02/18/2016  . Acute bilateral low back pain with right-sided sciatica 02/18/2016    Current Outpatient Prescriptions on File Prior to Visit  Medication Sig Dispense Refill  . acetaminophen (TYLENOL) 500 MG tablet Take 1 tablet (500 mg total) by mouth every 6 (six) hours as needed. 30 tablet 0  . diclofenac (VOLTAREN) 75 MG EC tablet Take 1 tablet (75 mg total) by mouth 2 (two) times daily as needed. (Patient not taking: Reported on 05/26/2016) 30 tablet 1  . diclofenac sodium (VOLTAREN) 1 % GEL Apply 2 g topically 4 (four) times daily. (Patient not taking: Reported on 05/26/2016) 100 g 0   No current facility-administered medications on file prior to visit.     No Known Allergies  Social History   Social History  . Marital status: Single    Spouse name: N/A  . Number of children: N/A  . Years of education: N/A   Social History Main Topics  . Smoking status: Never Smoker  . Smokeless tobacco:  Never Used  . Alcohol use No  . Drug use: No  . Sexual activity: Not Asked   Other Topics Concern  . None   Social History Narrative  . None    History reviewed. No pertinent surgical history.  History reviewed. No pertinent family history.  Review of Systems  Constitutional: Negative for activity change, appetite change, chills, diaphoresis, fatigue, fever and unexpected weight change.  HENT: Negative for congestion, dental problem, drooling, ear discharge, ear pain, facial swelling, hearing loss, mouth sores, nosebleeds, postnasal drip, rhinorrhea, sinus pain, sinus pressure, sneezing, sore throat, tinnitus, trouble swallowing and voice change.   Eyes: Negative for photophobia, pain, discharge, redness, itching and visual disturbance.  Respiratory: Negative for apnea, cough, choking, chest tightness, shortness of breath, wheezing and stridor.   Cardiovascular: Negative for chest pain, palpitations and leg swelling.  Gastrointestinal: Negative for abdominal distention, abdominal pain, anal bleeding, blood in stool, constipation, diarrhea, nausea, rectal pain and vomiting.  Endocrine: Negative for cold intolerance, heat intolerance, polydipsia, polyphagia and polyuria.  Genitourinary: Negative for decreased urine volume, difficulty urinating, discharge, dysuria, enuresis, flank pain, frequency, genital sores, hematuria, penile pain, penile swelling, scrotal swelling, testicular pain and urgency.  Musculoskeletal: Negative for arthralgias, back pain, gait problem, joint swelling, myalgias, neck pain and neck stiffness.  Skin: Negative for color change, pallor, rash and wound.  Allergic/Immunologic: Negative for environmental allergies, food allergies and immunocompromised state.  Neurological: Negative for dizziness, tremors, seizures, syncope, facial asymmetry, speech difficulty, weakness, light-headedness, numbness and headaches.  Hematological: Negative for adenopathy. Does not  bruise/bleed easily.  Psychiatric/Behavioral: Negative for agitation, behavioral problems, confusion, decreased concentration, dysphoric mood, hallucinations, self-injury, sleep disturbance and suicidal ideas. The patient is not nervous/anxious and is not hyperactive.     Objective:  BP (!) 130/92   Pulse (!) 110   Temp 98.4 F (36.9 C) (Oral)   Resp 16   Ht 5' 3.75" (1.619 m)   Wt 178 lb (80.7 kg)   SpO2 98%   BMI 30.79 kg/m   Physical Exam  Constitutional: He is oriented to person, place, and time and well-developed, well-nourished, and in no distress.  HENT:  Head: Normocephalic and atraumatic.  Right Ear: Hearing, tympanic membrane, external ear and ear canal normal.  Left Ear: Hearing, tympanic membrane, external ear and ear canal normal.  Nose: Nose normal.  Mouth/Throat: Uvula is midline, oropharynx is clear and moist and mucous membranes are normal. No oropharyngeal exudate.  Eyes: Conjunctivae and EOM are normal. Pupils are equal, round, and reactive to light.  Neck: Trachea normal and normal range of motion.  Cardiovascular: Normal rate, regular rhythm, normal heart sounds and intact distal pulses.   Pulmonary/Chest: Effort normal and breath sounds normal.  Abdominal: Soft. Normal appearance and bowel sounds are normal. There is no tenderness.  Musculoskeletal: Normal range of motion.  Left knee brace intact.    Lymphadenopathy:       Head (right side): No submental, no submandibular, no tonsillar, no preauricular, no posterior auricular and no occipital adenopathy present.       Head (left side): No submental, no submandibular, no tonsillar, no preauricular, no posterior auricular and no occipital adenopathy present.    He has no cervical adenopathy.       Right: No supraclavicular adenopathy present.       Left: No supraclavicular adenopathy present.  Neurological: He is alert and oriented to person, place, and time. He has normal sensation, normal strength and normal  reflexes. Gait normal.  Skin: Skin is warm and dry.  Psychiatric: Affect normal.  Vitals reviewed.     Visual Acuity Screening   Right eye Left eye Both eyes  Without correction:     With correction: 20/70 20/20-1 20/20-1   Results for orders placed or performed in visit on 05/26/16 (from the past 24 hour(s))  POCT urinalysis dipstick     Status: Abnormal   Collection Time: 05/26/16  5:34 PM  Result Value Ref Range   Color, UA yellow yellow   Clarity, UA clear clear   Glucose, UA negative negative mg/dL   Bilirubin, UA negative negative   Ketones, POC UA negative negative mg/dL   Spec Grav, UA <=1.005 (A) 1.010 - 1.025   Blood, UA negative negative   pH, UA 5.5 5.0 - 8.0   Protein Ur, POC negative negative mg/dL   Urobilinogen, UA 0.2 0.2 or 1.0 E.U./dL   Nitrite, UA Negative Negative   Leukocytes, UA Negative Negative    Assessment and Plan :  Discussed healthy lifestyle, diet, exercise, preventative care, vaccinations, and addressed patient's concerns. Plan for follow up in one year. Otherwise, plan for specific conditions below.  1. Annual physical exam Await lab results   2. Screening for metabolic disorder - [id number]  3. Screening, lipid - Lipid  panel  4. Screening, anemia, deficiency, iron - CBC with Differential/Platelet  5. Screening for hematuria or proteinuria - POCT urinalysis dipstick  6. Need for Tdap vaccination - Tdap vaccine greater than or equal to 7yo IM   Tenna Delaine PA-C  [facility] 05/26/2016 5:16 PM

## 2016-05-27 ENCOUNTER — Encounter: Payer: Self-pay | Admitting: Physician Assistant

## 2016-05-27 LAB — CBC WITH DIFFERENTIAL/PLATELET
Basophils Absolute: 0 10*3/uL (ref 0.0–0.2)
Basos: 1 %
EOS (ABSOLUTE): 0.2 10*3/uL (ref 0.0–0.4)
EOS: 3 %
HEMATOCRIT: 39.6 % (ref 37.5–51.0)
Hemoglobin: 13.9 g/dL (ref 13.0–17.7)
Immature Grans (Abs): 0 10*3/uL (ref 0.0–0.1)
Immature Granulocytes: 0 %
LYMPHS ABS: 2.7 10*3/uL (ref 0.7–3.1)
Lymphs: 44 %
MCH: 28.3 pg (ref 26.6–33.0)
MCHC: 35.1 g/dL (ref 31.5–35.7)
MCV: 81 fL (ref 79–97)
Monocytes Absolute: 0.5 10*3/uL (ref 0.1–0.9)
Monocytes: 8 %
NEUTROS ABS: 2.7 10*3/uL (ref 1.4–7.0)
Neutrophils: 44 %
PLATELETS: 340 10*3/uL (ref 150–379)
RBC: 4.92 x10E6/uL (ref 4.14–5.80)
RDW: 13.5 % (ref 12.3–15.4)
WBC: 6 10*3/uL (ref 3.4–10.8)

## 2016-05-27 LAB — CMP14+EGFR
A/G RATIO: 2 (ref 1.2–2.2)
ALT: 32 IU/L (ref 0–44)
AST: 25 IU/L (ref 0–40)
Albumin: 5.1 g/dL (ref 3.5–5.5)
Alkaline Phosphatase: 52 IU/L (ref 39–117)
BILIRUBIN TOTAL: 0.8 mg/dL (ref 0.0–1.2)
BUN/Creatinine Ratio: 14 (ref 9–20)
BUN: 11 mg/dL (ref 6–20)
CHLORIDE: 99 mmol/L (ref 96–106)
CO2: 24 mmol/L (ref 18–29)
Calcium: 9.9 mg/dL (ref 8.7–10.2)
Creatinine, Ser: 0.77 mg/dL (ref 0.76–1.27)
GFR calc Af Amer: 143 mL/min/{1.73_m2} (ref >59)
GFR calc non Af Amer: 123 mL/min/{1.73_m2} (ref >59)
Globulin, Total: 2.6 g/dL (ref 1.5–4.5)
Glucose: 91 mg/dL (ref 65–99)
POTASSIUM: 4.2 mmol/L (ref 3.5–5.2)
Sodium: 139 mmol/L (ref 134–144)
Total Protein: 7.7 g/dL (ref 6.0–8.5)

## 2016-05-27 LAB — LIPID PANEL
CHOLESTEROL TOTAL: 161 mg/dL (ref 100–199)
Chol/HDL Ratio: 5.2 ratio — ABNORMAL HIGH (ref 0.0–5.0)
HDL: 31 mg/dL — AB (ref >39)
LDL Calculated: 96 mg/dL (ref 0–99)
TRIGLYCERIDES: 172 mg/dL — AB (ref 0–149)
VLDL Cholesterol Cal: 34 mg/dL (ref 5–40)

## 2016-07-31 ENCOUNTER — Encounter: Payer: Self-pay | Admitting: Physician Assistant

## 2016-08-20 ENCOUNTER — Other Ambulatory Visit: Payer: Self-pay | Admitting: Specialist

## 2016-08-20 DIAGNOSIS — M222X2 Patellofemoral disorders, left knee: Secondary | ICD-10-CM

## 2016-08-30 ENCOUNTER — Encounter: Payer: Self-pay | Admitting: Physician Assistant

## 2016-08-30 ENCOUNTER — Ambulatory Visit
Admission: RE | Admit: 2016-08-30 | Discharge: 2016-08-30 | Disposition: A | Discharge status: Home / Self Care | Payer: Self-pay | Source: Ambulatory Visit | Attending: Specialist | Admitting: Specialist

## 2016-08-30 DIAGNOSIS — M222X2 Patellofemoral disorders, left knee: Secondary | ICD-10-CM

## 2016-09-16 ENCOUNTER — Encounter: Payer: Self-pay | Admitting: Physician Assistant

## 2017-05-05 ENCOUNTER — Encounter: Payer: Self-pay | Admitting: Physician Assistant

## 2017-05-16 IMAGING — DX DG KNEE COMPLETE 4+V*L*
4 series · 4 of 4 positions shown · non-contrast
Comparison: None.

CLINICAL DATA: Left knee patellofemoral syndrome

EXAM:
LEFT KNEE - COMPLETE 4+ VIEW

[knee ap]
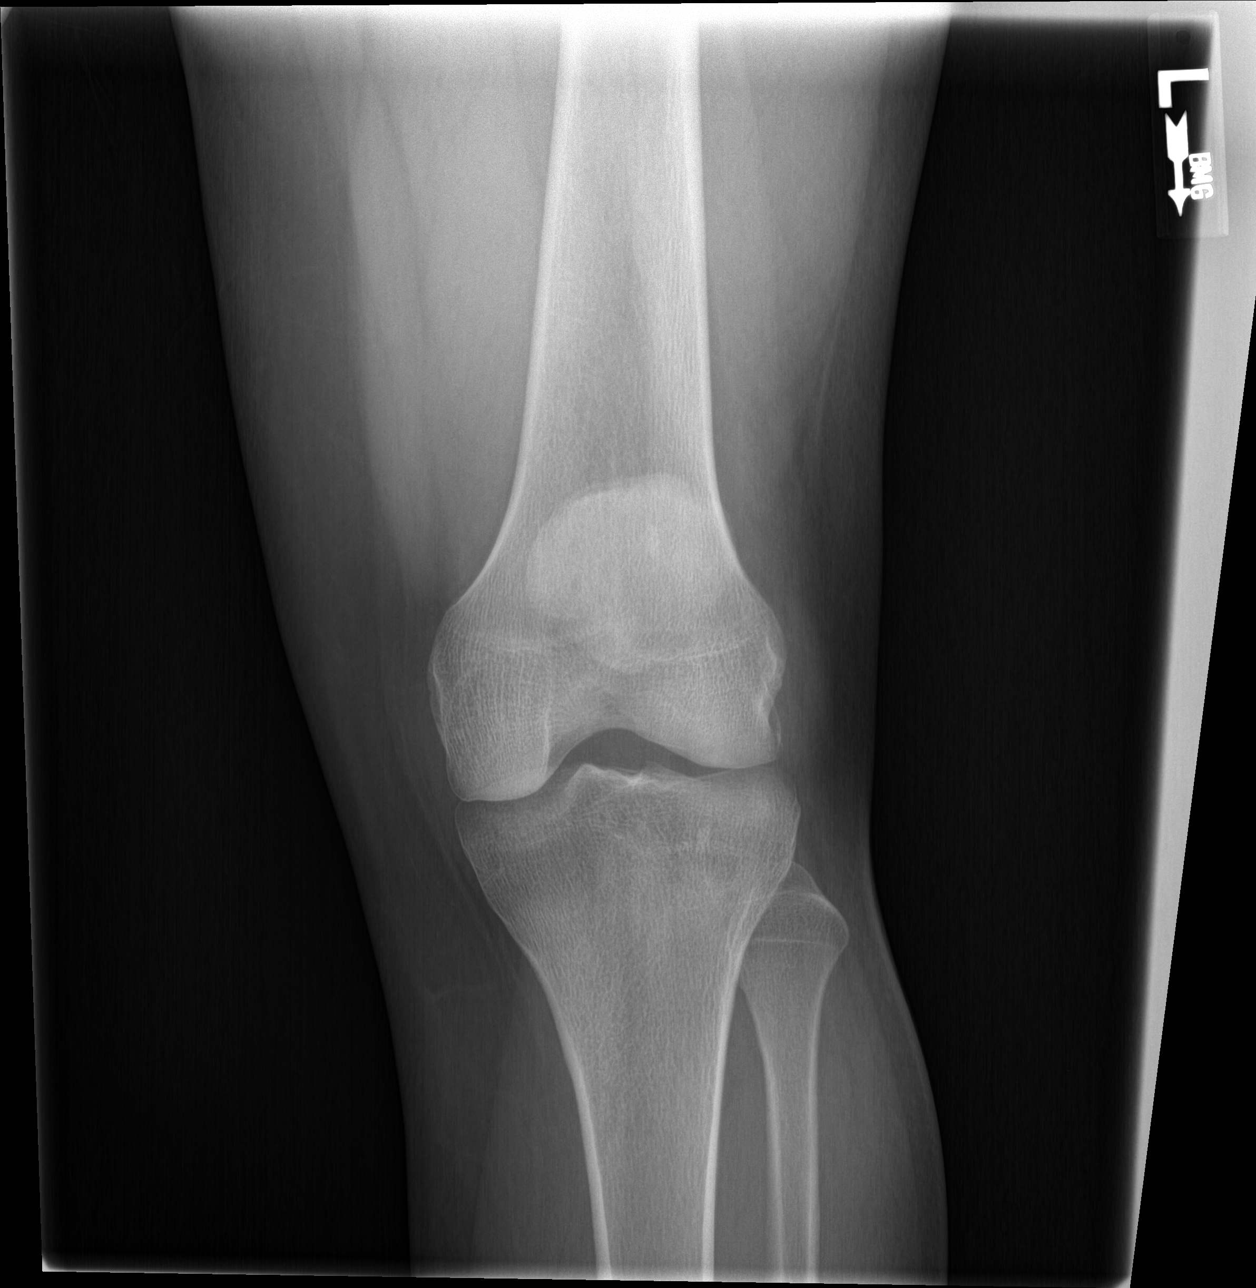

[knee lat]
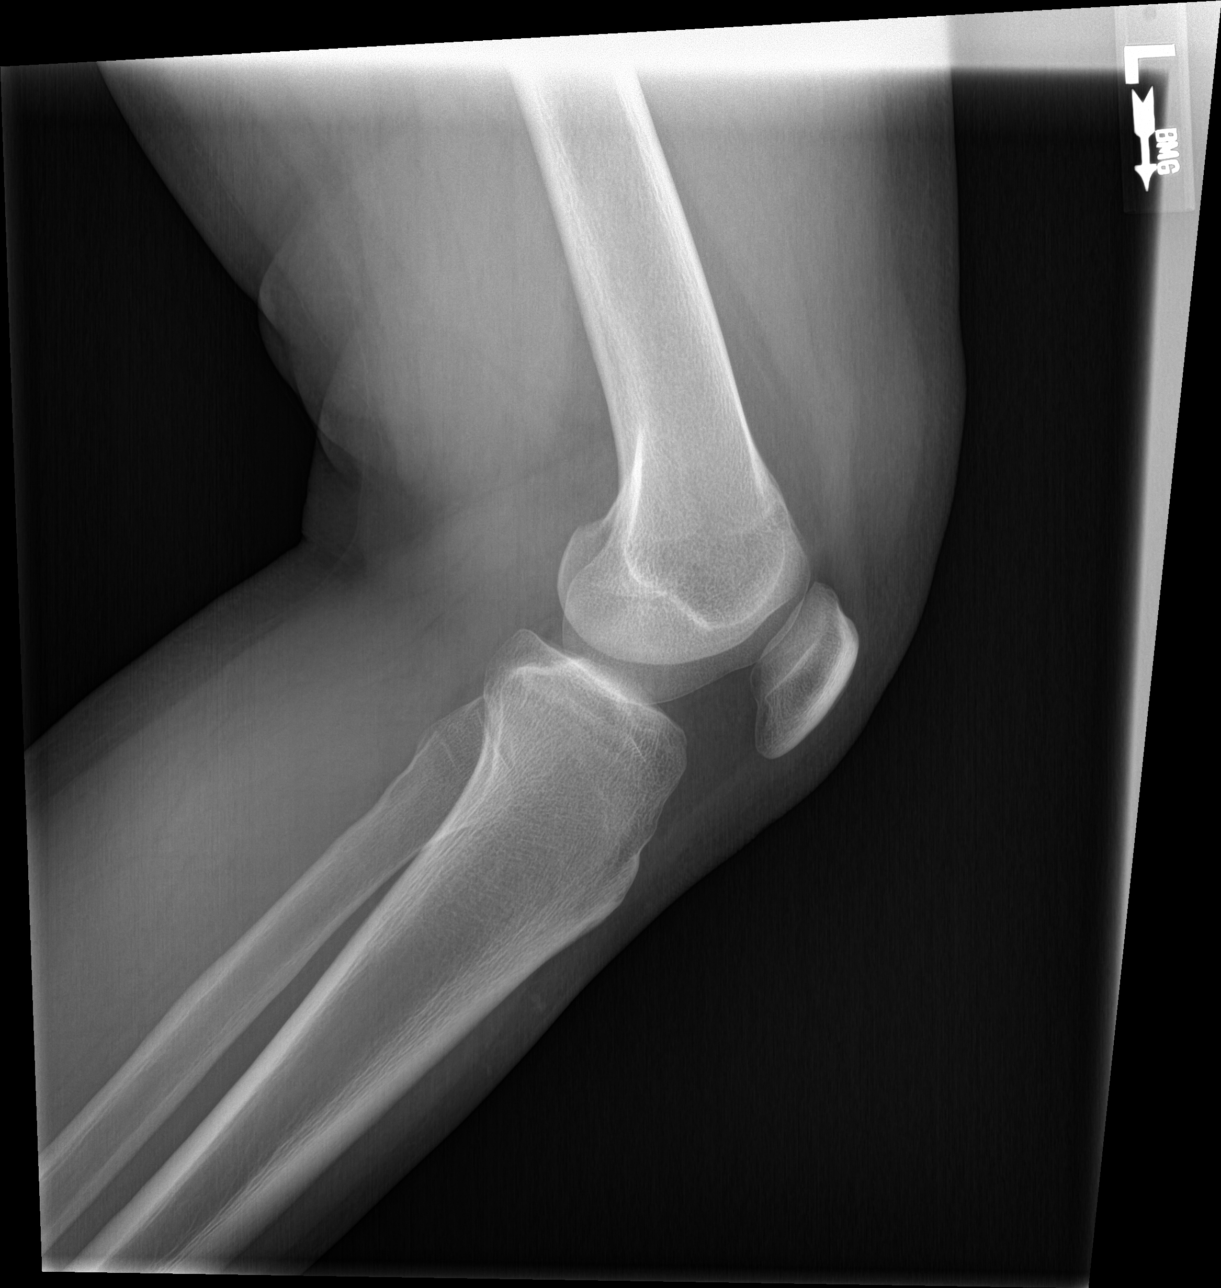

[sunrise]
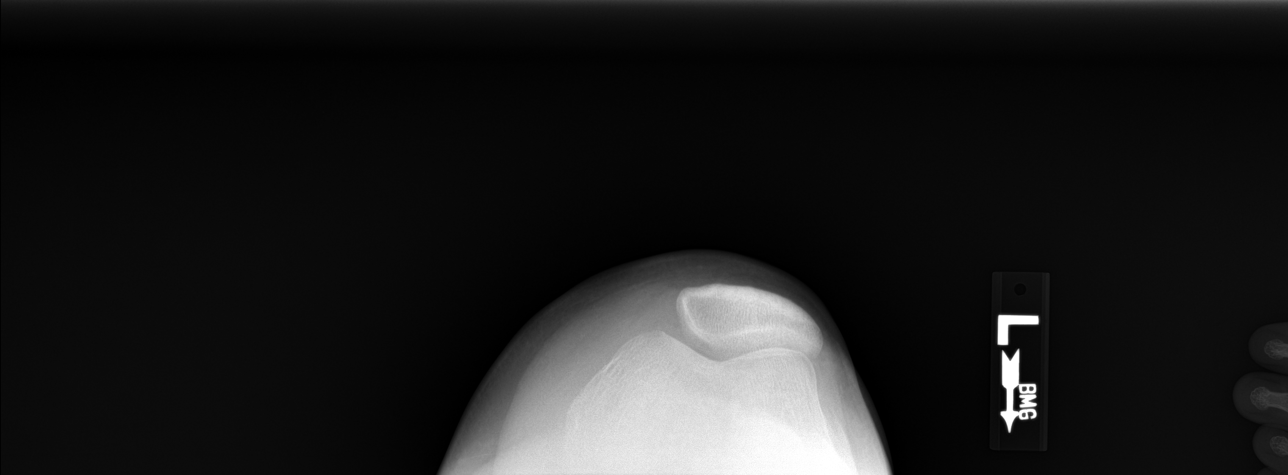

[knee [person_name]]
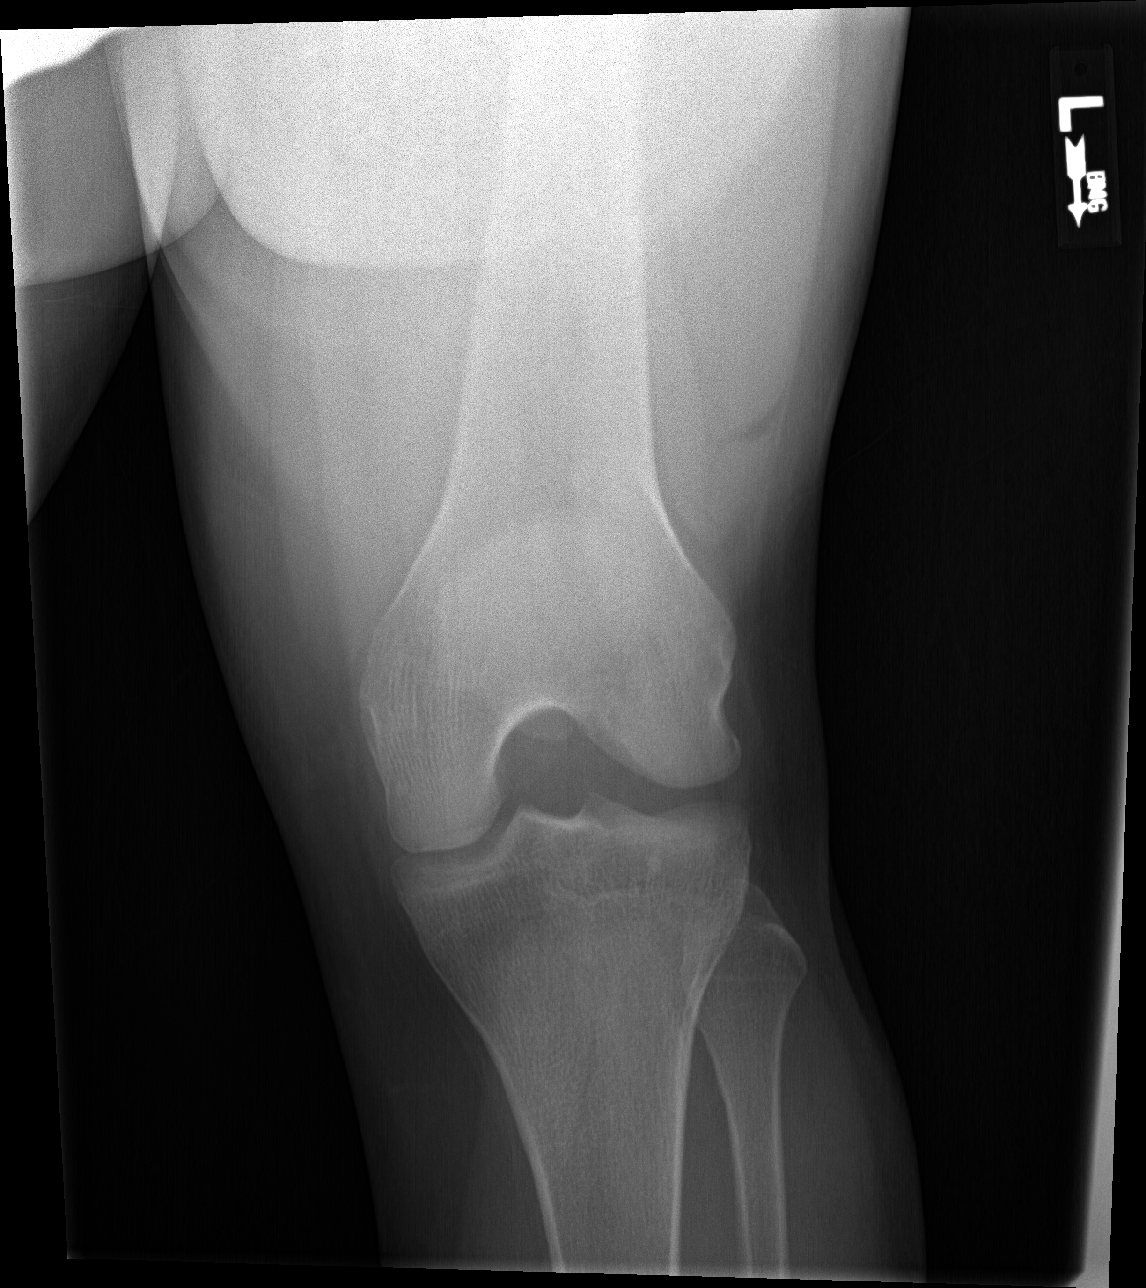

[4 of 4 positions shown; findings below may reference images not displayed]

FINDINGS: No fracture or dislocation is seen.

The joint spaces are preserved.

The visualized soft tissues are unremarkable.
IMPRESSION: Negative.

## 2018-03-01 ENCOUNTER — Encounter: Payer: Self-pay | Admitting: Physician Assistant

## 2018-07-06 ENCOUNTER — Encounter: Payer: No Typology Code available for payment source | Admitting: Emergency Medicine

## 2021-09-22 ENCOUNTER — Ambulatory Visit
Admission: EM | Admit: 2021-09-22 | Discharge: 2021-09-22 | Disposition: A | Discharge status: Home / Self Care | Payer: No Typology Code available for payment source | Attending: Urgent Care | Admitting: Urgent Care

## 2021-09-22 DIAGNOSIS — T148XXA Other injury of unspecified body region, initial encounter: Secondary | ICD-10-CM

## 2021-09-22 DIAGNOSIS — L089 Local infection of the skin and subcutaneous tissue, unspecified: Secondary | ICD-10-CM

## 2021-09-22 MED ORDER — BACITRACIN ZINC 500 UNIT/GM EX OINT: 1.0000 | TOPICAL_OINTMENT | Freq: Two times a day (BID) | CUTANEOUS | 0 refills | Status: AC

## 2021-09-22 MED ORDER — DOXYCYCLINE HYCLATE 100 MG PO CAPS: 100.0000 mg | ORAL_CAPSULE | Freq: Two times a day (BID) | ORAL | 0 refills | Status: AC

## 2021-09-22 NOTE — ED Triage Notes (Signed)
Pt. C/o rash on both left and right ankles that appeared over a week ago. Pt. States they appeared randomly. Pt. Has used neosporin to tx rashes.

## 2021-09-22 NOTE — ED Provider Notes (Signed)
  [facility]   MRN: [id number] DOB: Feb 06, 1985  Subjective:   Julian Mclean is a 36 y.o. male presenting for 2 persistent draining painful lesions over either side of his lower legs.  Patient is concerned about infection.  Has been doing a lot of traveling and feels like an insect may have bitten or stung him.  Has been using Neosporin to treat the rashes.  Denies fever, chest pain, nausea, vomiting, abdominal pain.  No current facility-administered medications for this encounter.  Current Outpatient Medications:    acetaminophen (TYLENOL) 500 MG tablet, Take 1 tablet (500 mg total) by mouth every 6 (six) hours as needed., Disp: 30 tablet, Rfl: 0   diclofenac sodium (VOLTAREN) 1 % GEL, Apply 2 g topically 4 (four) times daily. (Patient not taking: Reported on 05/26/2016), Disp: 100 g, Rfl: 0   No Known Allergies  History reviewed. No pertinent past medical history.   History reviewed. No pertinent surgical history.  History reviewed. No pertinent family history.  Social History   Tobacco Use   Smoking status: Never   Smokeless tobacco: Never  Substance Use Topics   Alcohol use: No   Drug use: No    ROS   Objective:   Vitals: BP (!) 141/97   Pulse 86   Temp 98 F (36.7 C)   Resp 16   SpO2 98%   Physical Exam Constitutional:      General: He is not in acute distress.    Appearance: Normal appearance. He is well-developed and normal weight. He is not ill-appearing, toxic-appearing or diaphoretic.  HENT:     Head: Normocephalic and atraumatic.     Right Ear: External ear normal.     Left Ear: External ear normal.     Nose: Nose normal.     Mouth/Throat:     Pharynx: Oropharynx is clear.  Eyes:     General: No scleral icterus.       Right eye: No discharge.        Left eye: No discharge.     Extraocular Movements: Extraocular movements intact.  Cardiovascular:     Rate and Rhythm: Normal rate.  Pulmonary:     Effort: Pulmonary  effort is normal.  Musculoskeletal:     Cervical back: Normal range of motion.       Legs:  Neurological:     Mental Status: He is alert and oriented to person, place, and time.  Psychiatric:        Mood and Affect: Mood normal.        Behavior: Behavior normal.        Thought Content: Thought content normal.        Judgment: Judgment normal.     Dressing applied to either wound.  Assessment and Plan :   PDMP not reviewed this encounter.  1. Infected wound    Start doxycycline, naproxen for pain and inflammation.  Discussed wound care.  Follow-up with wound care clinic, referral placed. Counseled patient on potential for adverse effects with medications prescribed/recommended today, ER and return-to-clinic precautions discussed, patient verbalized understanding.    Wallis Bamberg, [place] 09/22/21 1950

## 2021-09-22 NOTE — Discharge Instructions (Addendum)
Change your dressing 2-3 times daily. Every time you change your dressing, clean the wound gently with warm water and Dial antibacterial soap. Pat the wound dry, let it breathe for roughly an hour before covering it back up. When you reapply a dressing, use non-stick/non-adherent gauze.  Start doxycycline for the wound infection.
